# Patient Record
Sex: Female | Born: 1940 | Race: White | Hispanic: No | Marital: Single | Smoking: Never smoker
Health system: Southern US, Community
[De-identification: ages and names within clinical notes are randomized; demographics above are authoritative.]

## PROBLEM LIST (undated history)

## (undated) DIAGNOSIS — Z923 Personal history of irradiation: Secondary | ICD-10-CM

## (undated) DIAGNOSIS — E785 Hyperlipidemia, unspecified: Secondary | ICD-10-CM

## (undated) DIAGNOSIS — C50919 Malignant neoplasm of unspecified site of unspecified female breast: Secondary | ICD-10-CM

## (undated) HISTORY — DX: Hyperlipidemia, unspecified: E78.5

---

## 2007-05-23 DIAGNOSIS — C50919 Malignant neoplasm of unspecified site of unspecified female breast: Secondary | ICD-10-CM

## 2007-05-23 HISTORY — PX: BREAST LUMPECTOMY: SHX2

## 2007-05-23 HISTORY — DX: Malignant neoplasm of unspecified site of unspecified female breast: C50.919

## 2007-06-26 ENCOUNTER — Ambulatory Visit: Payer: Self-pay | Admitting: Cardiovascular Disease

## 2007-06-27 ENCOUNTER — Ambulatory Visit (HOSPITAL_COMMUNITY): Admission: RE | Admit: 2007-06-27 | Discharge: 2007-06-27 | Payer: Self-pay | Admitting: Cardiovascular Disease

## 2007-06-27 ENCOUNTER — Ambulatory Visit: Payer: Self-pay | Admitting: Internal Medicine

## 2007-07-10 ENCOUNTER — Ambulatory Visit: Payer: Self-pay | Admitting: Cardiovascular Disease

## 2007-07-11 ENCOUNTER — Ambulatory Visit (HOSPITAL_COMMUNITY): Admission: RE | Admit: 2007-07-11 | Discharge: 2007-07-11 | Payer: Self-pay | Admitting: Family Medicine

## 2007-08-05 ENCOUNTER — Ambulatory Visit (HOSPITAL_COMMUNITY): Admission: RE | Admit: 2007-08-05 | Discharge: 2007-08-05 | Payer: Self-pay | Admitting: Family Medicine

## 2007-08-12 ENCOUNTER — Encounter: Admission: RE | Admit: 2007-08-12 | Discharge: 2007-08-12 | Payer: Self-pay | Admitting: Family Medicine

## 2007-08-12 ENCOUNTER — Encounter (INDEPENDENT_AMBULATORY_CARE_PROVIDER_SITE_OTHER): Payer: Self-pay | Admitting: Diagnostic Radiology

## 2007-08-16 ENCOUNTER — Ambulatory Visit (HOSPITAL_COMMUNITY): Admission: RE | Admit: 2007-08-16 | Discharge: 2007-08-16 | Payer: Self-pay | Admitting: Family Medicine

## 2007-09-03 ENCOUNTER — Encounter (INDEPENDENT_AMBULATORY_CARE_PROVIDER_SITE_OTHER): Payer: Self-pay | Admitting: General Surgery

## 2007-09-03 ENCOUNTER — Encounter: Admission: RE | Admit: 2007-09-03 | Discharge: 2007-09-03 | Payer: Self-pay | Admitting: General Surgery

## 2007-09-03 ENCOUNTER — Ambulatory Visit (HOSPITAL_COMMUNITY): Admission: RE | Admit: 2007-09-03 | Discharge: 2007-09-03 | Payer: Self-pay | Admitting: General Surgery

## 2007-09-06 ENCOUNTER — Ambulatory Visit: Payer: Self-pay | Admitting: Oncology

## 2007-09-25 ENCOUNTER — Ambulatory Visit: Admission: RE | Admit: 2007-09-25 | Discharge: 2007-12-13 | Payer: Self-pay | Admitting: Radiation Oncology

## 2007-10-03 ENCOUNTER — Ambulatory Visit: Payer: Self-pay | Admitting: Cardiovascular Disease

## 2008-07-16 ENCOUNTER — Ambulatory Visit (HOSPITAL_COMMUNITY): Admission: RE | Admit: 2008-07-16 | Discharge: 2008-07-16 | Payer: Self-pay | Admitting: Hematology and Oncology

## 2008-08-18 DIAGNOSIS — E7800 Pure hypercholesterolemia, unspecified: Secondary | ICD-10-CM

## 2008-08-18 DIAGNOSIS — I10 Essential (primary) hypertension: Secondary | ICD-10-CM | POA: Insufficient documentation

## 2008-08-18 DIAGNOSIS — F329 Major depressive disorder, single episode, unspecified: Secondary | ICD-10-CM

## 2008-08-18 DIAGNOSIS — R0602 Shortness of breath: Secondary | ICD-10-CM | POA: Insufficient documentation

## 2008-08-18 DIAGNOSIS — R609 Edema, unspecified: Secondary | ICD-10-CM | POA: Insufficient documentation

## 2008-08-18 DIAGNOSIS — R079 Chest pain, unspecified: Secondary | ICD-10-CM

## 2008-08-25 ENCOUNTER — Ambulatory Visit: Payer: Self-pay | Admitting: Cardiovascular Disease

## 2009-01-28 ENCOUNTER — Encounter (INDEPENDENT_AMBULATORY_CARE_PROVIDER_SITE_OTHER): Payer: Self-pay | Admitting: *Deleted

## 2009-02-16 ENCOUNTER — Encounter: Payer: Self-pay | Admitting: Cardiovascular Disease

## 2009-07-28 ENCOUNTER — Ambulatory Visit (HOSPITAL_COMMUNITY): Admission: RE | Admit: 2009-07-28 | Discharge: 2009-07-28 | Payer: Self-pay | Admitting: Hematology and Oncology

## 2010-06-12 ENCOUNTER — Encounter: Payer: Self-pay | Admitting: Family Medicine

## 2010-06-23 ENCOUNTER — Other Ambulatory Visit: Payer: Self-pay | Admitting: General Surgery

## 2010-06-28 ENCOUNTER — Ambulatory Visit
Admission: RE | Admit: 2010-06-28 | Discharge: 2010-06-28 | Disposition: A | Discharge status: Home / Self Care | Payer: BC Managed Care – PPO | Source: Ambulatory Visit | Attending: General Surgery | Admitting: General Surgery

## 2010-07-11 ENCOUNTER — Ambulatory Visit (HOSPITAL_COMMUNITY)
Admission: RE | Admit: 2010-07-11 | Discharge: 2010-07-11 | Disposition: A | Discharge status: Home / Self Care | Payer: BC Managed Care – PPO | Source: Ambulatory Visit | Attending: General Surgery | Admitting: General Surgery

## 2010-07-11 ENCOUNTER — Other Ambulatory Visit: Payer: Self-pay | Admitting: General Surgery

## 2010-07-11 ENCOUNTER — Encounter (HOSPITAL_COMMUNITY): Payer: BC Managed Care – PPO

## 2010-07-11 ENCOUNTER — Other Ambulatory Visit (HOSPITAL_COMMUNITY): Payer: Self-pay | Admitting: General Surgery

## 2010-07-11 DIAGNOSIS — Z01811 Encounter for preprocedural respiratory examination: Secondary | ICD-10-CM

## 2010-07-11 DIAGNOSIS — Z01818 Encounter for other preprocedural examination: Secondary | ICD-10-CM | POA: Insufficient documentation

## 2010-07-11 DIAGNOSIS — Z01812 Encounter for preprocedural laboratory examination: Secondary | ICD-10-CM | POA: Insufficient documentation

## 2010-07-11 DIAGNOSIS — R9431 Abnormal electrocardiogram [ECG] [EKG]: Secondary | ICD-10-CM | POA: Insufficient documentation

## 2010-07-11 DIAGNOSIS — I517 Cardiomegaly: Secondary | ICD-10-CM | POA: Insufficient documentation

## 2010-07-11 DIAGNOSIS — K449 Diaphragmatic hernia without obstruction or gangrene: Secondary | ICD-10-CM | POA: Insufficient documentation

## 2010-07-11 DIAGNOSIS — Z01810 Encounter for preprocedural cardiovascular examination: Secondary | ICD-10-CM | POA: Insufficient documentation

## 2010-07-11 LAB — DIFFERENTIAL
Basophils Relative: 0 % (ref 0–1)
Eosinophils Absolute: 0.2 10*3/uL (ref 0.0–0.7)
Eosinophils Relative: 2 % (ref 0–5)
Lymphocytes Relative: 30 % (ref 12–46)
Monocytes Absolute: 0.7 10*3/uL (ref 0.1–1.0)
Monocytes Relative: 10 % (ref 3–12)

## 2010-07-11 LAB — CBC
MCH: 24.1 pg — ABNORMAL LOW (ref 26.0–34.0)
MCHC: 32 g/dL (ref 30.0–36.0)
MCV: 75.3 fL — ABNORMAL LOW (ref 78.0–100.0)
Platelets: 264 10*3/uL (ref 150–400)
RBC: 4.94 MIL/uL (ref 3.87–5.11)
RDW: 15.5 % (ref 11.5–15.5)

## 2010-07-11 LAB — COMPREHENSIVE METABOLIC PANEL
ALT: 17 U/L (ref 0–35)
Alkaline Phosphatase: 121 U/L — ABNORMAL HIGH (ref 39–117)
Calcium: 9 mg/dL (ref 8.4–10.5)
Chloride: 104 mEq/L (ref 96–112)
GFR calc Af Amer: >60 mL/min (ref >60)
GFR calc non Af Amer: 59 mL/min — ABNORMAL LOW (ref >60)
Glucose, Bld: 117 mg/dL — ABNORMAL HIGH (ref 70–99)
Potassium: 2.9 mEq/L — ABNORMAL LOW (ref 3.5–5.1)
Sodium: 138 mEq/L (ref 135–145)
Total Bilirubin: 0.5 mg/dL (ref 0.3–1.2)

## 2010-07-11 LAB — SURGICAL PCR SCREEN
MRSA, PCR: NEGATIVE
Staphylococcus aureus: POSITIVE — AB

## 2010-07-12 LAB — PREALBUMIN: Prealbumin: 15.4 mg/dL — ABNORMAL LOW (ref 17.0–34.0)

## 2010-07-18 ENCOUNTER — Inpatient Hospital Stay (HOSPITAL_COMMUNITY)
Admission: RE | Admit: 2010-07-18 | Discharge: 2010-07-18 | DRG: 467 | Disposition: A | Discharge status: Home / Self Care | Payer: BC Managed Care – PPO | Source: Ambulatory Visit | Attending: General Surgery | Admitting: General Surgery

## 2010-07-18 DIAGNOSIS — Z01818 Encounter for other preprocedural examination: Principal | ICD-10-CM

## 2010-07-18 DIAGNOSIS — Z01812 Encounter for preprocedural laboratory examination: Secondary | ICD-10-CM

## 2010-08-04 ENCOUNTER — Other Ambulatory Visit: Payer: Self-pay | Admitting: General Surgery

## 2010-08-04 ENCOUNTER — Encounter (HOSPITAL_COMMUNITY): Payer: BC Managed Care – PPO

## 2010-08-04 LAB — COMPREHENSIVE METABOLIC PANEL
AST: 37 U/L (ref 0–37)
Alkaline Phosphatase: 163 U/L — ABNORMAL HIGH (ref 39–117)
BUN: 14 mg/dL (ref 6–23)
CO2: 28 mEq/L (ref 19–32)
Calcium: 9.2 mg/dL (ref 8.4–10.5)
Chloride: 104 mEq/L (ref 96–112)
Glucose, Bld: 98 mg/dL (ref 70–99)
Potassium: 3.5 mEq/L (ref 3.5–5.1)
Sodium: 142 mEq/L (ref 135–145)
Total Bilirubin: 0.7 mg/dL (ref 0.3–1.2)
Total Protein: 7.4 g/dL (ref 6.0–8.3)

## 2010-08-04 LAB — CBC
HCT: 36.5 % (ref 36.0–46.0)
Hemoglobin: 11.5 g/dL — ABNORMAL LOW (ref 12.0–15.0)
MCV: 74.6 fL — ABNORMAL LOW (ref 78.0–100.0)
RDW: 15.3 % (ref 11.5–15.5)
WBC: 7.5 10*3/uL (ref 4.0–10.5)

## 2010-08-04 LAB — DIFFERENTIAL
Basophils Absolute: 0 10*3/uL (ref 0.0–0.1)
Basophils Relative: 0 % (ref 0–1)
Eosinophils Absolute: 0.2 10*3/uL (ref 0.0–0.7)

## 2010-08-05 LAB — PREALBUMIN: Prealbumin: 21.2 mg/dL (ref 17.0–34.0)

## 2010-08-11 ENCOUNTER — Inpatient Hospital Stay (HOSPITAL_COMMUNITY)
Admission: RE | Admit: 2010-08-11 | Discharge: 2010-08-13 | DRG: 155 | Disposition: A | Discharge status: Home / Self Care | Payer: BC Managed Care – PPO | Source: Ambulatory Visit | Attending: General Surgery | Admitting: General Surgery

## 2010-08-11 ENCOUNTER — Inpatient Hospital Stay (HOSPITAL_COMMUNITY): Admission: RE | Admit: 2010-08-11 | Payer: BC Managed Care – PPO | Source: Ambulatory Visit | Admitting: General Surgery

## 2010-08-11 DIAGNOSIS — I10 Essential (primary) hypertension: Secondary | ICD-10-CM | POA: Diagnosis present

## 2010-08-11 DIAGNOSIS — Z01812 Encounter for preprocedural laboratory examination: Secondary | ICD-10-CM

## 2010-08-11 DIAGNOSIS — K449 Diaphragmatic hernia without obstruction or gangrene: Principal | ICD-10-CM | POA: Diagnosis present

## 2010-08-12 ENCOUNTER — Inpatient Hospital Stay (HOSPITAL_COMMUNITY): Payer: BC Managed Care – PPO

## 2010-08-12 MED ORDER — IOHEXOL 300 MG/ML  SOLN
50.0000 mL | Freq: Once | INTRAMUSCULAR | Status: AC | PRN
  Administered 2010-08-12: 50 mL via ORAL

## 2010-08-31 NOTE — Op Note (Signed)
NAMEBRENDALYN, [name] [name]                 ACCOUNT NO.:  [id number]  MEDICAL RECORD NO.:  [id number]           PATIENT TYPE:  I  LOCATION:  1527                         FACILITY:  [facility]  PHYSICIAN:  Lennie Muckle, MD      DATE OF BIRTH:  1940-12-23  DATE OF PROCEDURE:  08/11/2010 DATE OF DISCHARGE:                              OPERATIVE REPORT   PREOPERATIVE DIAGNOSIS:  Paraesophageal hernia.  POSTOPERATIVE DIAGNOSIS:  Paraesophageal hernia.  PROCEDURE:  Laparoscopic paraesophageal hernia repair with Nissen.  SURGEON:  Lennie Muckle, MD.  ASSISTANT:  Abigail Miyamoto, M.D.  ESTIMATED BLOOD LOSS:  Minimal.  ANESTHESIA:  General.  DRAIN:  A 19 Blake drain was placed in the mediastinal space.  COMPLICATIONS:  No immediate complications.  INDICATIONS FOR PROCEDURE:  [name] is a very pleasant 70 year old female who I had previously performed breast surgery on.  She had emesis noted over the summer, had an endoscopy revealed a large hiatal hernia. She did have anemia with iron deficient.  Due to her melena and reflux disease, I felt that she probably needed a repair of her hernia.  She did have a CT scan, which showed a large portion of her stomach within the chest cavity.  I verified with a barium swallow as well, which revealed half of the stomach within the chest.  There was reflux and the primary peristalsis was as normal for her age.  We had tentatively planned for a day earlier in February, however, she suffered a bug bite and was unable to have the procedure.  She received antibiotics and seen back in the office and she had resolution of her infection.  She was ready to have her procedure.  Informed consent was obtained.  PROCEDURE IN DETAIL:  The patient was identified in the preoperative holding area, seen by Anesthesia, received IV antibiotics preoperatively, and taken to the operating room.  Once in the operating room, placed in supine position.  After administration  of general endotracheal anesthesia, sequential compression devices were applied to her lower extremity.  A Foley catheter was placed.  Her abdomen was then prepped and draped in usual sterile fashion.  Surgical time-out was performed.  I began by placing a 5 mm trocar using Optiview into the abdominal cavity on the left side of the abdomen.  After insufflating the abdomen, I inspected the abdomen and found no evidence of injury upon placement of trocar.  I then placed 2 additional trocars on the left side of the abdomen, one at the midline and one on the right side under visualization of the camera.  One of these was 11 mm trocar in the left side of the abdomen.  We began by identifying the liver.  There was a large left lobe of the liver.  I placed a 5 mm trocar at the epigastric region.  After placing the port, I placed the liver retractor to hold up the left lobe of the liver.  This was secured in place allowing [place] to visualize the hiatus.  There was a rather large hernia defect.  The stomach was spontaneously reduced down  into the chest when the patient was placed in reverse Trendelenburg position.  I began by dissecting the thin adhesion between the stomach and the liver.  Using a Harmonic scalpel, I transected this carried me up into the right crus of the diaphragm.  I was able to separate the hernia sac from the right crus of the diaphragm.  Using blunt dissection and the Harmonic scalpel, cleared the hernia sac off the right crus.  I carried this anteriorly and posteriorly.  I was able to lift the esophagus and stomach off the right crus and then identified the left crus without much difficulty.  I continued dissecting anteriorly the hernia sac from the diaphragm.  She had a large amount of paraesophageal fat in the hernia sac.  I then began dissecting the short gastrics taking these down with the Harmonic scalpel leading [place] up to the left crus of the diaphragm.  We  fully mobilized the greater curve of the stomach taking down the short gastrics.  I was able to identify fully the left crus of the diaphragm. I dissected the hernia sac from the left crus.  I was able to place a Penrose around the GE junction.  I continued dissecting the loose attachments of the esophagus while tugging on the Penrose drain.  I was able to carry up into the chest and underneath the heart.  Mostly using the Harmonic scalpel and blunt dissection, I was able to fully mobilize the esophagus.  I identified the anterior and vagus nerves.  This gave me enough length for the stomach greater than 2 cm within the abdominal cavity.  I then started dissecting the hernia sac off the stomach.  It was a rather thick sac, but I was able to dissect this away from the stomach to fully visualize the GE junction.  I had to clip the small feeding vessel with a clip applier till there was a small amount of bleeding.  After controlling this, I continued dissecting the hernia sac off the lesser curve of the stomach.  This coursed up near the esophagus, but I stayed clear distance away using Harmonic scalpel and transected the sac to remove this from the vicinity in order to facilitate the wrap in the future. After removing the fat, I placed it in the EndoCatch bag and removed it from the abdomen.  I was then able to close the diaphragm using interrupted sutures and the autosuture device.  I used pledgets and placed approximately 4 sutures for closure. At that time, I was able to lay the stomach in normal anatomic position. I began by placing the cardiac region of the stomach around posteriorly to facilitate the wrap portion.  Once I had the stomach and positioned for the wrap, I asked Anesthesia to pass a #56 bougie.  This was placed without difficulty.  We watched this coming to the abdomen.  Once this was in position, I was able to perform the wrap using the autosuture device.  Three sutures  were placed with the length of 4 cm.  I did grasp a small amount of the esophageal tissue in the superior portions of the sutures.  I then tacked the superior portion of the wrap to the crus on the left and right portion to prevent from slippage.  I then secured a 19 Blake drain into the mediastinal space.  This was secured with a 3-0 nylon suture.  I then irrigated the abdomen.  I inspected the wrap was in good position.  I found no bleeding.  I then released the liver, closed the fascial defect at the liver.  I removed the 11 mm trocar and closed the fascia with a 0 Vicryl suture using a suture passer.  I did one final inspection of the abdomen, found no evidence of bleeding.  No evidence of organ injury.  I then released pneumoperitoneum, removed the trocars.  Approximately, 40 mL of 0.25% Marcaine with epinephrine were placed for local block.  The skin was closed with 4-0 Monocryl.  Dermabond was placed for final dressing.  The patient was extubated, transferred to postanesthesia care unit in stable condition.  She will be monitored for a day or so.  I will get a swallow tomorrow and then hopefully home on Saturday.     Lennie Muckle, MD     ALA/MEDQ  D:  08/11/2010  T:  08/12/2010  Job:  [id number]  cc:   Milana Kidney, MD [facility]  Viviann Spare MD Azucena Kuba  Dr. Dewayne Shorter [facility]  Electronically Signed by Bertram Savin MD on 08/31/2010 12:05:02 PM

## 2010-09-26 NOTE — Discharge Summary (Signed)
  NAMEERRYN, DICKISON                 ACCOUNT NO.:  [id number]  MEDICAL RECORD NO.:  [id number]           PATIENT TYPE:  I  LOCATION:  1527                         FACILITY:  [facility]  PHYSICIAN:  Lennie Muckle, MD      DATE OF BIRTH:  December 24, 1940  DATE OF ADMISSION:  08/11/2010 DATE OF DISCHARGE:  08/13/2010                              DISCHARGE SUMMARY   FINAL DIAGNOSIS:  Paraesophageal hernia.  PROCEDURE:  March 22 paraesophageal hernia repair.  HOSPITAL COURSE:  [name] was admitted following her paraesophageal hernia.  She had a gastrograph and swallow on postoperative day #1.  No leak was evident.  She was able to be started on her liquid diet.  She was able to tolerate her liquids.  Her drain was removed.  Her pain was adequately controlled on oral narcotics.  She has been ambulating without difficulty.  She will be discharged with the Percocet for pain. Instructed no heavy lifting.  Continue on her liquid diet for 3 weeks. Follow up with me in approximately 2 to 3 weeks' time.  CONDITION ON DISCHARGE:  Condition upon discharge is stable.  MEDICATIONS:  She will continue on all of her home medications including aspirin, Diovan, folic acid, iron, metoprolol, Mobic, Nexium, Norvasc, potassium and Prozac.     Lennie Muckle, MD     ALA/MEDQ  D:  08/31/2010  T:  08/31/2010  Job:  [id number]  Electronically Signed by Bertram Savin MD on 09/26/2010 10:39:22 AM

## 2010-09-29 ENCOUNTER — Other Ambulatory Visit (HOSPITAL_COMMUNITY): Payer: Self-pay | Admitting: Hematology and Oncology

## 2010-09-29 DIAGNOSIS — C50919 Malignant neoplasm of unspecified site of unspecified female breast: Secondary | ICD-10-CM

## 2010-10-04 NOTE — Assessment & Plan Note (Signed)
[facility]                       [place] [facility]IOLOG[facility] OFFICE NOTE   NAME:[name], [name]                          MRN:          [id number]  DATE:06/25/2007                            DOB:          1941/03/27    [name] [name] seen at request of Dr. __________.  She is the sister-in-law of one  of my long-term the patients, [name] [name].  She was recently  hospitalized in [place] for dyspnea.  The patient was visiting her  sister she had acute onset of shortness of breath.  Unfortunately do not  have a lot of records from there.  It appears that on November 17 she  had a stress Myoview.   The patient received adenosine.  The report reads that she had a  moderate-sized anterior septal apical inferior lateral reversible  perfusion defect consistent with ischemia, and notation indicates needs  heart catheter.  EF was normal at 65%.   Apparently this was cancelled.  I do not all of the details but [name] [name]'s  sister-in-law wanted her to come see me.   In talking to the patient a coronary risk factors include hypertension.   The patient does not smoke.  She is a nondiabetic.  Cholesterol status  is unknown.   FAMILY HISTORY:  Negative.   She is a nondiabetic.   The patient has noticed increasing shortness of breath over the last  year.  There is no direct history of asthma.  I do not have PFTs.  She  is a nonsmoker.  She has not had a recent cough there is no history of  DVT or PE.  No history of pulmonary hypertension.  Do not have a chest x-  ray report on her or any discharge summary from her hospitalization.  She went to visit her sister and said that she got wheezy.  She thinks  part of this is a hiatal hernia.  She is apparently has a fairly large  one is on [name].  The patient was in the hospital for 3 days and was  discharged.  Since discharge she has not had any acute episodes but does  note increasing exertional dyspnea.  Systems otherwise negative.   PAST MEDICAL HISTORY:  Is remarkable for previous hysterectomy, previous  wrist surgery hypertension, question hyperlipidemia occurred with hiatal  hernia, depression previous excessive alcohol, and question iron  deficiency.   FAMILY HISTORY:  Is remarkable for alcoholism on her father's side, died  at age 80.  Brain cancer on mother's side.  The patient has been  divorced twice.  She has three children.  She works at [employer] in [employer].  She enjoys sewing.   CURRENT MEDICATIONS:  Include Cardura 10/10, Diovan 320/12.5 [name] 20,  Prozac 20, aspirin a day, potassium an iron.  She denies any allergies.   EXAM:  Is remarkable for a overweight white female with a jovial affect  in no distress.  Her weight is 191, blood pressure is 150/80, pulse 80 and regular,  afebrile.  HEENT:  Unremarkable.  Carotids are without bruit.  No adenopathy meds JVP elevation was  clear.  Diaphragmatic motion.  No wheezing.  S1-S2 normal heart sounds.  PMI normal at this benign.  Bowel sounds positive, status post hysterectomy.  No bruit.  No  tenderness.  No AAA no past no hepatojugular reflux.  Distal pulse intact.  No edema.  NEURO:  Nonfocal.  Skin warm, dry.  No muscle weakness.   EKG shows sinus rhythm, nonspecific ST-T wave changes.  No previous MI.   IMPRESSION:  1. Hospitalization for somewhat atypical chest pain and dyspnea      abnormal Myoview.  I need to get the actual images certainly given      her body habitus.  This could be a false positive.  Clinical      scenario is atypical.  Her EKG has nonspecific after I get the      actual images we will make a decision regarding possible heart cath      versus dobutamine echo.  2. Hypertension currently borderline control.  Continue current      medications.  Consider adding clonidine will have her monitor her      blood pressures at home.  3. Cholesterol status unknown.  Need to follow-up lipid liver profile.       Consider addition a statin drug.  4. History of depression with alcoholism.  Continue Prozac 20 a day,      currently abstinent from alcohol.  5. Large hiatal hernia with reflux.  Continue [name] 20 a day may be      the etiology to her dyspnea and chest pain.  6. Question of pulmonary problem with wheezing and exertional dyspnea      check 2-D echocardiogram to assess RV and LV function PFTs pre and      positive bronchodilator.   Further recommendations based on the results of my review of the actual  pictures from her Myoview echo and PFTs I will see her back in a few  weeks when in.     Noralyn Pick. Eden Emms, MD, [facility]  Electronically Signed    PCN/MedQ  DD: 06/26/2007  DT: 06/27/2007  Job #: [id number]

## 2010-10-04 NOTE — Procedures (Signed)
NAMEDAYAMI, [name] [name]                 ACCOUNT NO.:  [id number]   MEDICAL RECORD NO.:  [id number]          PATIENT TYPE:  OUT   LOCATION:  RESP                          FACILITY:  [facility]   PHYSICIAN:  Edward L. Juanetta Gosling, M.D.DATE OF BIRTH:  1941/04/16   DATE OF PROCEDURE:  06/27/2007  DATE OF DISCHARGE:  06/27/2007                            PULMONARY FUNCTION TEST   1. Spirometry shows no ventilatory defect and no definite airflow      obstruction.  2. Lung volumes are normal.  3. DLCO is mildly reduced.  4. Arterial blood gases are normal.      Edward L. Juanetta Gosling, M.D.  Electronically Signed     ELH/MEDQ  D:  07/05/2007  T:  07/06/2007  Job:  [id number]   cc:   Noralyn Pick. Eden Emms, MD, [facility]  [address]  [address]  [place]  [place] [address]

## 2010-10-04 NOTE — Procedures (Signed)
[name] [name], [name] [name]                 ACCOUNT NO.:  [id number]   MEDICAL RECORD NO.:  [id number]          PATIENT TYPE:  OUT   LOCATION:  RAD                           FACILITY:  [facility]   PHYSICIAN:  Pricilla Riffle, MD, FACCDATE OF BIRTH:  February 07, 1941   DATE OF PROCEDURE:  06/27/2007  DATE OF DISCHARGE:                                ECHOCARDIOGRAM   TEST INDICATION:  A 70 year old with history of hypertension and  shortness of breath, test to evaluate.   2-D ECHO WITH ECHO DOPPLER:  Left ventricle is normal in size with an  end-diastolic dimension of 45 mm.  The interventricular septum and  posterior wall are mild to moderately thickened at 16 mm each.  Left  atrium is normal at 34 mm.  Right atrium and right ventricle are normal.  Aortic root is normal at 37 mm.   The aortic valve is mildly sclerotic.  There is trace insufficiency.  No  stenosis.  Mitral valve is mildly thickened with trace insufficiency.  Pulmonic valve is normal, no insufficiency.  Tricuspid valve is normal  with no insufficiency.   Overall LV systolic function is normal with an LVEF of 60-70%.  Note,  there appears to be mild diastolic dysfunction.  RVEF is normal.   No pericardial effusion is seen.      Pricilla Riffle, MD, [facility]  Electronically Signed     PVR/MEDQ  D:  06/27/2007  T:  06/28/2007  Job:  [id number]   cc:   Melvyn Novas, MD  Fax: [phone]

## 2010-10-04 NOTE — Op Note (Signed)
NAMESHAMONIQUE, BATTISTE                 ACCOUNT NO.:  [id number]   MEDICAL RECORD NO.:  [id number]          PATIENT TYPE:  AMB   LOCATION:  SDS                          FACILITY:  [facility]   PHYSICIAN:  Lennie Muckle, MD      DATE OF BIRTH:  1941/05/05   DATE OF PROCEDURE:  09/03/2007  DATE OF DISCHARGE:  09/03/2007                               OPERATIVE REPORT   PREOPERATIVE DIAGNOSES:  Left breast cancer.   POSTOPERATIVE DIAGNOSES:  Left breast cancer.   PROCEDURE:  Left breast lumpectomy and sentinel node biopsy.   SURGEON:  Lennie Muckle, MD   ASSISTANT:  Ollen Gross. Vernell Morgans, M.D.   ANESTHESIA:  General endotracheal anesthesia.   FINDINGS:  Calcifications, wire and clip within specimen based on the  mammographic imaging.   ESTIMATED BLOOD LOSS:  Minimal amount of bleeding.   INDICATIONS FOR PROCEDURE:  Ms. 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  Informed  consent was obtained for a lumpectomy, sentinel lymph node with  possibility of an axillary node dissection.   DETAILS OF PROCEDURE:  She was identified in the preoperative holding  suite where she previously had a wire placement in her left breast.  She  had received an injection of the radioactive tracer material.  She was  then taken to the operating room, placed in supine position.  After  administration of general endotracheal anesthesia, her left arm and left  breast were prepped and draped in usual sterile fashion.  A time out  procedure indicating the patient and procedure were performed.  Methylene blue was injected into the left breast in all 4 quadrants just  beneath the areola and massaged the breast for approximately 5 minutes.  Using the NeoProbe as a guide, I placed an incision on the left axilla  in the area of increased intensity.  Subcutaneous tissue divided with  electrocautery.   I was able to identify a lymph node which was deep to  the pectoralis muscle measured 2808 on the NeoProbe and was blue.  This  was sent to pathology.  A touch prep revealed no abnormal cells.  Subcutaneous tissues were closed after irrigation.  Monocryl was used to  close the skin.   Then, I turned my attention to the left breast.  The mass was  approximately 10 cm superior to the nipple at 12 o'clock position based  on mammographic imaging. An incision was placed approximately two thirds  the distant from the nipple and the wire.  I created a flap superiorly  with the wire in position.  The excisional lumpectomy specimen was  removed using the wire as a guide.  I did go deep onto  the pec muscle.  A specimen was oriented for pathology with a short single suture marking  the anterior margin.  A short double marking superior along lateral, a  single with lateral border and double along the  deep margin.  This was  sent to radiographic imaging.  The clip was noted as well as the wire  with the specimen in the lumpectomy tissue.  The area was irrigated.  There was no evidence of bleeding on final inspection.  The wound bed  was closed with 3-0 Vicryl suture.  A 4-0 was used to close the skin.  The area was injected with approximately 30 mL of lidocaine to fill the  void.  Dermabond was placed for final dressing.  The patient was then  extubated and transferred to the post anesthesia care unit in stable  condition.  I will call her with her final pathology and see her back in  approximately 2-3 weeks.  She is instructed to perform no heavy lifting  with her left arm.  Begin stretching this tomorrow, may shower tonight  and is given Percocet for pain.      Lennie Muckle, MD  Electronically Signed     ALA/MEDQ  D:  09/03/2007  T:  09/04/2007  Job:  [id number]

## 2010-10-04 NOTE — Assessment & Plan Note (Signed)
[facility]                       [place] [facility]ARDIOLOG[facility] OFFICE NOTE   NAME:[name] [name] HOCHBERG                        MRN:          [id number]  DATE:07/10/2007                            DOB:          [birth year]    Sunday returns today for follow-up.  I actually finally received records  from [facility] in [place], [place].  I reviewed her  Myoview study.  I was a bit unimpressed.  The inferior lateral defect  that was caused was misread.  She actually had more uptake in the  inferior lateral wall on stress images than rest images.  There was a  small apical defect, but this was probably rotation of axis deficit  particularly in a patient with such a large amount of breast tissue.  I  did not think the patient warranted heart cath at this time, in  particular after talking their she has not had any recurrent chest pain.   A 2-D echocardiogram was normal except for diastolic dysfunction.   I suspect that her dyspnea is a combination of being overweight,  hypertensive with diastolic dysfunction.   REVIEW OF SYSTEMS:  Remarkable for no significant chest pain,  palpitations, PND, she has mild lower extremity edema.   She has had a recent GI illness and lost about 12 pounds.   Otherwise negative.   MEDICATIONS:  She is on  1. Caduet 10/25.  2. Diovan 320/12.5.  3. [name] 20 a day.  4. Prozac 20 a day.  5. An aspirin a day.  6. Potassium 20 a day.  7. Iron.   [place] uses [employer] in [place]   PHYSICAL EXAMINATION:  GENERAL APPEARANCE:  Her exam is remarkable for a  large-breasted, overweight white female in no distress.  Affect is  appropriate.  VITAL SIGNS:  Weight is 190, blood pressure is 130/80, pulse 70 and  regular, respiratory rate 16, afebrile.  HEENT:  Unremarkable.  NECK:  Carotids are without bruit.  No lymphadenopathy, thyromegaly or  JVP elevation.  LUNGS:  Clear with good  diaphragmatic motion.  No wheezing.  CARDIAC:  S1-S2, normal heart sounds.  PMI normal.  ABDOMEN:  Benign.  Bowel sounds positive.  No AAA.  No  hepatosplenomegaly or hepatojugular reflux.  EXTREMITIES:  Distal pulses intact.  Trace edema.  NEUROLOGIC:  Nonfocal.  SKIN:  Warm and dry.  No muscle weakness.   IMPRESSION:  Isolated episode of chest pain.  Myoview reviewed and  thought to be low-risk, no recurrent chest pain.  Continue risk factor  modification.   RECOMMENDATIONS:  1. Cath only for recurrent more typical symptoms, continue aspirin a      day.  2. Hypertension, currently well-controlled.  Continue Caduet and      Diovan, low-salt diet.  3. Lower extremity edema.  I will increase the hydrochlorothiazide      component of the Diovan to 320/25, low-salt diet.  Samples were      given.  Three to four weeks after being on the high dose diuretic,      she will have her potassium checked.  4. Hypercholesterolemia.  Continue Caduet, low cholesterol diet.   Overall, [name] [name] seems to be doing well.  She would appear to have  hypertension with some diastolic dysfunction.  We will try to treat her  risk factors as well as possible and she will let us know if she has any  significant chest pain.     [name] [name]. Eden Emms, MD, [facility]  Electronically Signed    PCN/MedQ  DD: 07/10/2007  DT: 07/11/2007  Job #: [id number]

## 2010-10-04 NOTE — Assessment & Plan Note (Signed)
[facility]                       [place] CARDIOLOGY OFFICE NOTE   NAME:Azer, FABIHA ROUGEAU                        MRN:          [id number]  DATE:10/03/2007                            DOB:          1940/11/26    HISTORY OF PRESENT ILLNESS:  Yarisa Lynam returns today for followup.  I  have seen her in the past for dyspnea and atypical chest pain.  She had  a Myoview done at outside hospital which I reviewed and thought was  normal.  She has been doing well.  Her hypotension and  hypercholesterolemia are under better control.  Her lower extremity  edema has improved on a higher dose of diuretic.  She had her surgery  for breast cancer.  Apparently, this went well.  She needs adjuvant  radiation.  She has seen Dr. Cleone Slim.   Fortunately, she does not need chemotherapy.   REVIEW OF SYSTEMS:  Otherwise, negative.  In particular, she is not  having significant chest pain and her dyspnea is improved.   CURRENT MEDICATIONS:  1. Caduet 10/20.  2. Diovan 320/25.  3. [name] 20 a day.  4. Prozac 20 a day.  5. Aspirin a day.  6. Potassium and iron have been discontinued.  7. Actonel 35 mg a week.   She uses the [facility] in [place].  No medicines or  refills were needed today.   PHYSICAL EXAMINATION:  VITAL SIGNS:  Remarkable for weight of 188, blood  pressure 140/80, pulse 60 and regular, afebrile, respiratory rate 14.  HEENT:  Unremarkable.  NECK:  Carotids normal without bruit.  No lymphadenopathy, thyromegaly,  JVP elevation.  She is status post lumpectomy.  HEART:  S1-S2, normal heart sounds.  PMI normal.  ABDOMEN:  Benign.  Bowel sounds positive.  No AAA.  No tenderness.  No  bruit.  No hepatosplenomegaly.  No hepatojugular reflux.  Pulses are  intact.  No edema.  NEUROLOGICAL:  Nonfocal.  SKIN:  Warm and dry.  No muscular weakness.   IMPRESSION:  1. Chest pain resolved.  Low-risk Myoview.  Continue baby aspirin.  2. Hypertension  currently well-controlled.  Continue Caduet and      Diovan.  3. Lower extremity edema improved on higher dose HCTZ, continue low-      salt diet, elevate      legs at the end of the day.  4. Hypercholesterolemia.  Continue Caduet.  Lipid and liver profile in      6 months.   Overall, Kataleyah is doing well.  I will probably see her back in [place]  in 6 months.     Noralyn Pick. Eden Emms, MD, [facility]  Electronically Signed    PCN/MedQ  DD: 10/03/2007  DT: 10/03/2007  Job #: [id number]

## 2010-10-04 NOTE — Assessment & Plan Note (Signed)
[facility]                            CARDIOLOGY OFFICE NOTE   NAME:[name] [name], [name] [name]                        MRN:          [id number]  DATE:08/25/2008                            DOB:          [birth year]    [name] [name] returns today for followup.  She has had atypical chest pain in the  past.  She has not had any recurrences.   I believe some of her chest pain was related to the breast cancer which  was taken care of last year, she is on tamoxifen.  [name] [name] is her  oncologist.  Her blood pressure is borderline controlled.  She has been  compliant with her medicines.  We talked about low-sodium DASH type  diet.  She is currently on Diovan and potassium replacement as well as  beta blockers and Norvasc.  She sees Dr. Delbert Harness in [place] for  this.   She has mild exertional dyspnea.  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  Her review of systems are otherwise  looked at and all other systems negative.  She is allergic to CELEBREX.  Her medications include:  Tamoxifen 20 mg a day, ferrous sulfate,  vitamin D, potassium, Caltrate, Diovan HCT 320/25, Toprol 25 mg a day,  Nexium 40 mg a day, Norvasc 5 mg a day, Actonel, and folic acid.   PHYSICAL EXAMINATION:  GENERAL:  Remarkable for an overweight female in  no distress.  VITAL SIGNS:  Blood pressure is 160/80, pulse 70 and regular,  respiratory rate 14, afebrile.  HEENT:  Unremarkable.  NECK:  Carotids normal without bruit.  No lymphadenopathy, thyromegaly,  JVP elevation.  LUNGS:  Clear.  Good diaphragmatic motion.  No wheezing.  HEART:  S1, S2 with normal heart sounds.  PMI normal.  ABDOMEN:  Benign.  Bowel sounds positive.  No AAA.  No tenderness.  No  bruit.  No hepatosplenomegaly, hepatojugular reflux, or tenderness.  EXTREMITIES:  Distal pulses intact.  No edema.  NEUROLOGIC:  Nonfocal.  SKIN:   Warm and dry.  No muscular weakness.  Blood pressure was done on  the right arm since she is status post mastectomy.   EKG today shows sinus rhythm with nonspecific ST-T wave changes,  borderline LVH.   IMPRESSION:  1. Hypertension, currently well controlled.  Continue current dose of      beta blocker, Norvasc, Diovan HCT.  Follow up with Dr. Delbert Harness.      Low-sodium diet.  2. Previous chest pain without documented coronary disease.  Continue      aspirin therapy.  No recurrent chest pain.  3. Palpitations, benign, likely related to stress.  Currently in sinus      rhythm.  Continue low-dose beta-blocker.  4. History of breast cancer.  Follow up [name] [name].  Continue      adjuvant therapy with tamoxifen.   I will see her back in 6 months' time.     Noralyn Pick. Eden Emms, MD, [facility]  Electronically Signed  PCN/MedQ  DD: 08/25/2008  DT: 08/25/2008  Job #: [id number]

## 2010-10-05 ENCOUNTER — Ambulatory Visit (HOSPITAL_COMMUNITY)
Admission: RE | Admit: 2010-10-05 | Discharge: 2010-10-05 | Disposition: A | Discharge status: Home / Self Care | Payer: BC Managed Care – PPO | Source: Ambulatory Visit | Attending: Hematology and Oncology | Admitting: Hematology and Oncology

## 2010-10-05 DIAGNOSIS — Z853 Personal history of malignant neoplasm of breast: Secondary | ICD-10-CM | POA: Insufficient documentation

## 2010-10-05 DIAGNOSIS — C50919 Malignant neoplasm of unspecified site of unspecified female breast: Secondary | ICD-10-CM

## 2010-11-07 ENCOUNTER — Other Ambulatory Visit (INDEPENDENT_AMBULATORY_CARE_PROVIDER_SITE_OTHER): Payer: Self-pay | Admitting: General Surgery

## 2010-11-07 DIAGNOSIS — K828 Other specified diseases of gallbladder: Secondary | ICD-10-CM

## 2010-11-14 ENCOUNTER — Encounter (HOSPITAL_COMMUNITY)
Admission: RE | Admit: 2010-11-14 | Discharge: 2010-11-14 | Disposition: A | Discharge status: Home / Self Care | Payer: BC Managed Care – PPO | Source: Ambulatory Visit | Attending: General Surgery | Admitting: General Surgery

## 2010-11-14 DIAGNOSIS — K828 Other specified diseases of gallbladder: Secondary | ICD-10-CM | POA: Insufficient documentation

## 2010-11-14 DIAGNOSIS — Z01818 Encounter for other preprocedural examination: Secondary | ICD-10-CM | POA: Insufficient documentation

## 2010-11-14 DIAGNOSIS — Z01812 Encounter for preprocedural laboratory examination: Secondary | ICD-10-CM | POA: Insufficient documentation

## 2010-11-14 LAB — BASIC METABOLIC PANEL
BUN: 19 mg/dL (ref 6–23)
CO2: 26 mEq/L (ref 19–32)
Chloride: 103 mEq/L (ref 96–112)
Creatinine, Ser: 0.62 mg/dL (ref 0.50–1.10)
Glucose, Bld: 104 mg/dL — ABNORMAL HIGH (ref 70–99)
Potassium: 4 mEq/L (ref 3.5–5.1)

## 2010-11-14 LAB — CBC
HCT: 36.9 % (ref 36.0–46.0)
Hemoglobin: 12.1 g/dL (ref 12.0–15.0)
MCV: 73.8 fL — ABNORMAL LOW (ref 78.0–100.0)
WBC: 6.9 10*3/uL (ref 4.0–10.5)

## 2010-11-14 MED ORDER — TECHNETIUM TC 99M MEBROFENIN IV KIT
5.5000 | PACK | Freq: Once | INTRAVENOUS | Status: AC | PRN
  Administered 2010-11-14: 6 via INTRAVENOUS

## 2010-11-16 ENCOUNTER — Ambulatory Visit (HOSPITAL_COMMUNITY): Admission: RE | Admit: 2010-11-16 | Payer: BC Managed Care – PPO | Source: Ambulatory Visit | Admitting: General Surgery

## 2011-02-10 LAB — BLOOD GAS, ARTERIAL
Bicarbonate: 23.5
TCO2: 21.3
pCO2 arterial: 35.5
pH, Arterial: 7.437 — ABNORMAL HIGH

## 2011-02-14 LAB — COMPREHENSIVE METABOLIC PANEL
ALT: 18
AST: 30
Albumin: 3.9
Alkaline Phosphatase: 123 — ABNORMAL HIGH
BUN: 15
Chloride: 101
Potassium: 3.2 — ABNORMAL LOW
Total Bilirubin: 0.5

## 2011-02-14 LAB — CBC
HCT: 32.9 — ABNORMAL LOW
Platelets: 300
WBC: 8.8

## 2011-04-11 ENCOUNTER — Other Ambulatory Visit (HOSPITAL_COMMUNITY): Payer: Self-pay | Admitting: Hematology and Oncology

## 2011-04-11 DIAGNOSIS — C50919 Malignant neoplasm of unspecified site of unspecified female breast: Secondary | ICD-10-CM

## 2011-10-11 ENCOUNTER — Ambulatory Visit (HOSPITAL_COMMUNITY)
Admission: RE | Admit: 2011-10-11 | Discharge: 2011-10-11 | Disposition: A | Discharge status: Home / Self Care | Payer: BC Managed Care – PPO | Source: Ambulatory Visit | Attending: Hematology and Oncology | Admitting: Hematology and Oncology

## 2011-10-11 DIAGNOSIS — C50919 Malignant neoplasm of unspecified site of unspecified female breast: Secondary | ICD-10-CM

## 2011-10-11 DIAGNOSIS — Z853 Personal history of malignant neoplasm of breast: Secondary | ICD-10-CM | POA: Insufficient documentation

## 2011-10-18 ENCOUNTER — Encounter: Payer: BC Managed Care – PPO | Admitting: Hematology and Oncology

## 2011-10-18 DIAGNOSIS — M810 Age-related osteoporosis without current pathological fracture: Secondary | ICD-10-CM

## 2011-10-18 DIAGNOSIS — I10 Essential (primary) hypertension: Secondary | ICD-10-CM

## 2011-10-18 DIAGNOSIS — C50919 Malignant neoplasm of unspecified site of unspecified female breast: Secondary | ICD-10-CM

## 2012-05-18 IMAGING — RF DG ESOPHAGUS
19 of 24 series · 19 of 24 positions shown · non-contrast
Comparison: Plain film chest of 09/03/2007.

CLINICAL DATA: Hiatal hernia.  Evaluate for reflux.

ESOPHOGRAM/BARIUM SWALLOW
TECHNIQUE: Combined double contrast and single contrast
examination performed using effervescent crystals, thick barium
liquid, and thin barium liquid.
Fluoroscopy time:  3.3 minutes.

[Series 1: run · 1 of 8 slices shown (1 of 19)]
[im 1/8]
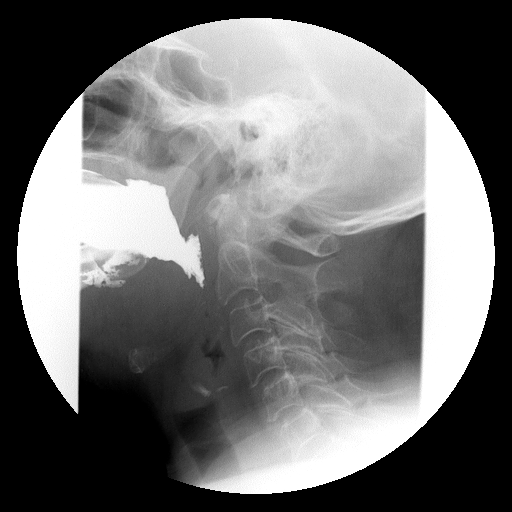

[Series 2: run · 1 of 1 slices shown (2 of 19)]
[im 1/1]
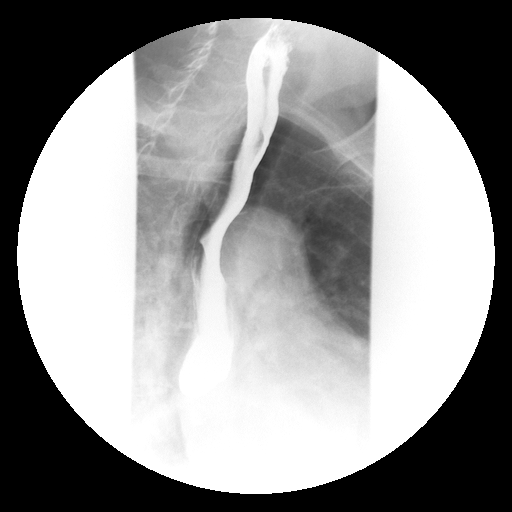

[Series 4: run · 1 of 1 slices shown (3 of 19)]
[im 1/1]
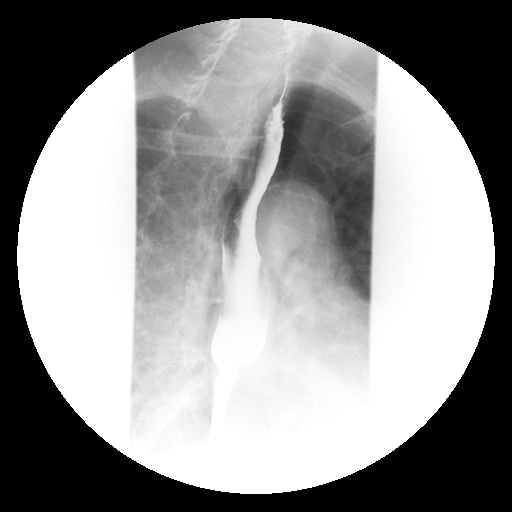

[Series 5: run · 1 of 1 slices shown (4 of 19)]
[im 1/1]
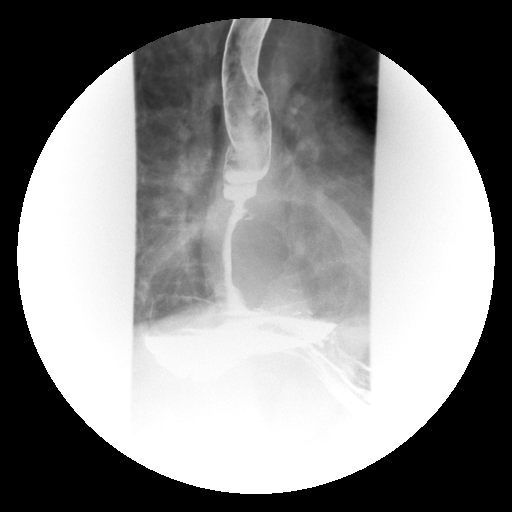

[Series 6: run · 1 of 1 slices shown (5 of 19)]
[im 1/1]
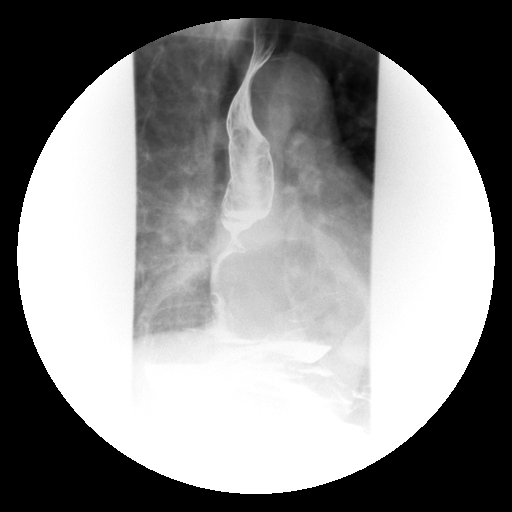

[Series 7: run · 1 of 1 slices shown (6 of 19)]
[im 1/1]
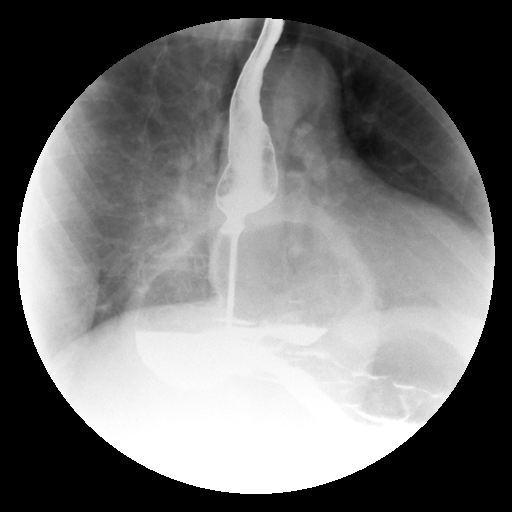

[Series 9: run · 1 of 1 slices shown (7 of 19)]
[im 1/1]
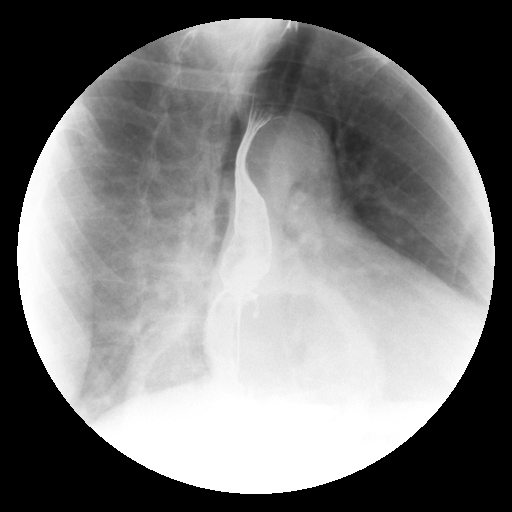

[Series 10: run · 1 of 1 slices shown (8 of 19)]
[im 1/1]
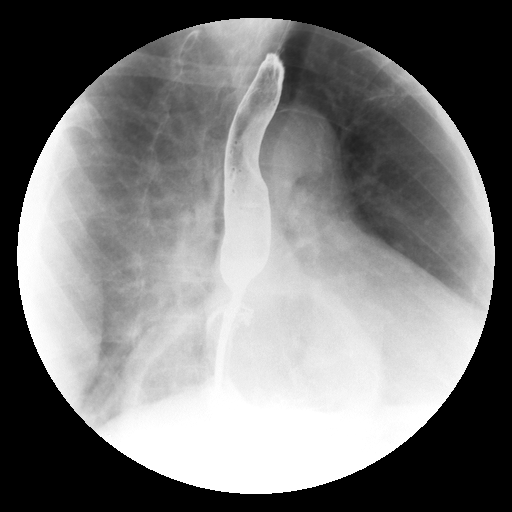

[Series 11: run · 1 of 1 slices shown (9 of 19)]
[im 1/1]
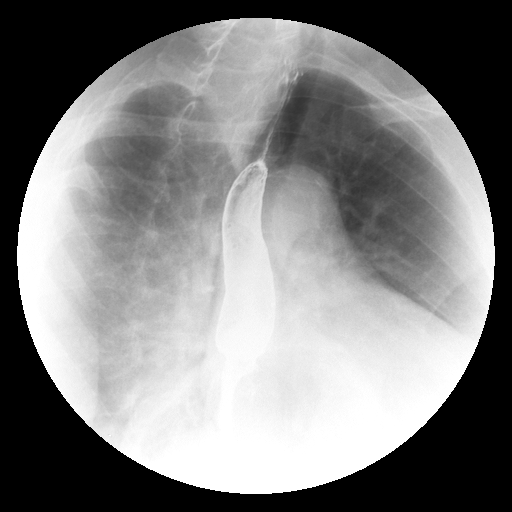

[Series 13: run · 1 of 1 slices shown (10 of 19)]
[im 1/1]
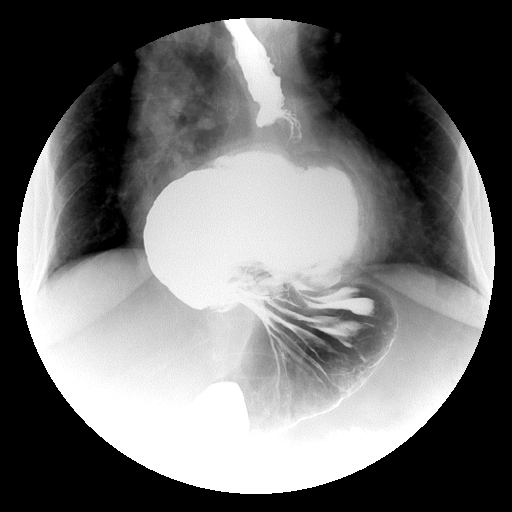

[Series 14: run · 1 of 1 slices shown (11 of 19)]
[im 1/1]
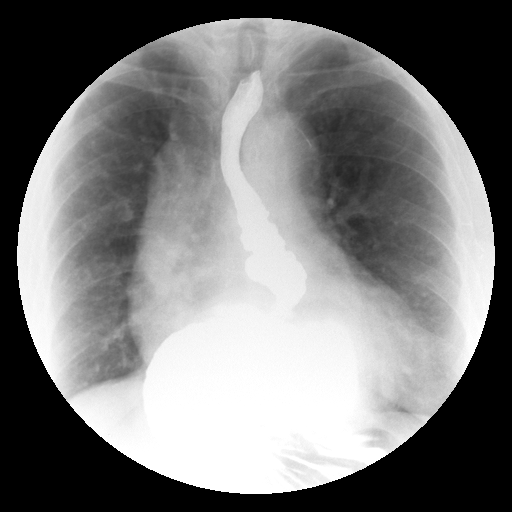

[Series 15: run · 1 of 1 slices shown (12 of 19)]
[im 1/1]
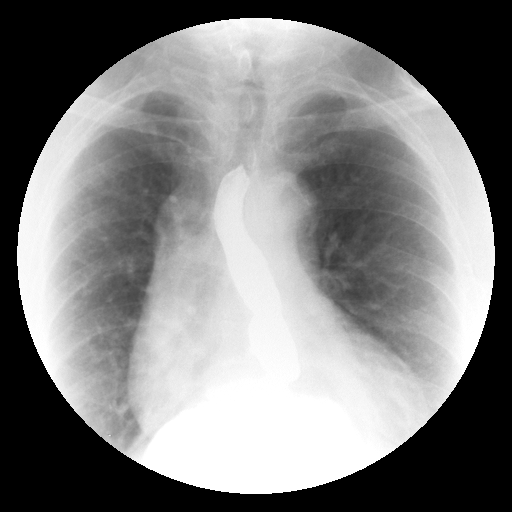

[Series 16: run · 1 of 1 slices shown (13 of 19)]
[im 1/1]
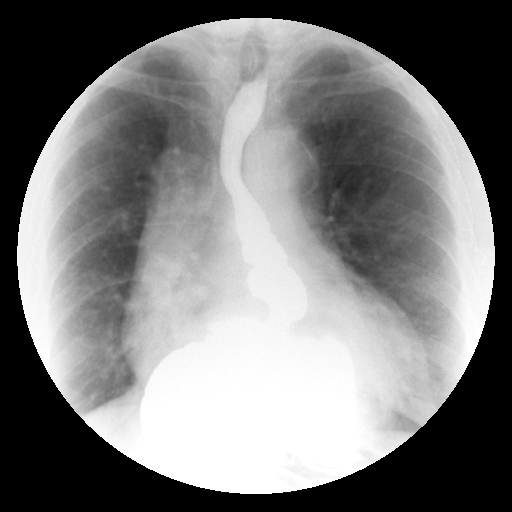

[Series 18: run · 1 of 1 slices shown (14 of 19)]
[im 1/1]
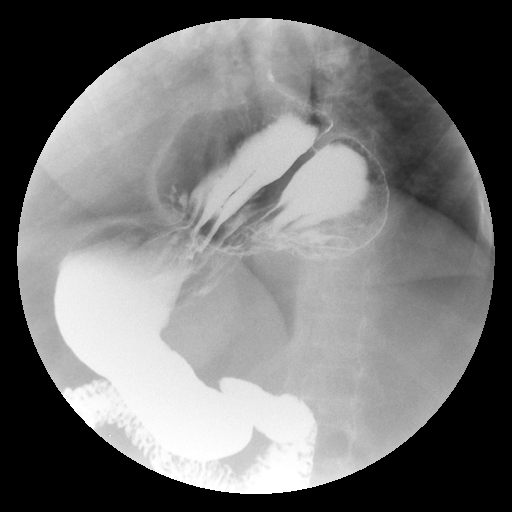

[Series 19: run · 1 of 1 slices shown (15 of 19)]
[im 1/1]
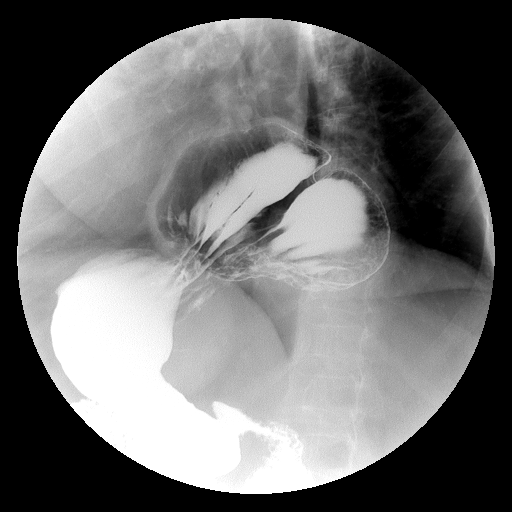

[Series 20: run · 1 of 1 slices shown (16 of 19)]
[im 1/1]
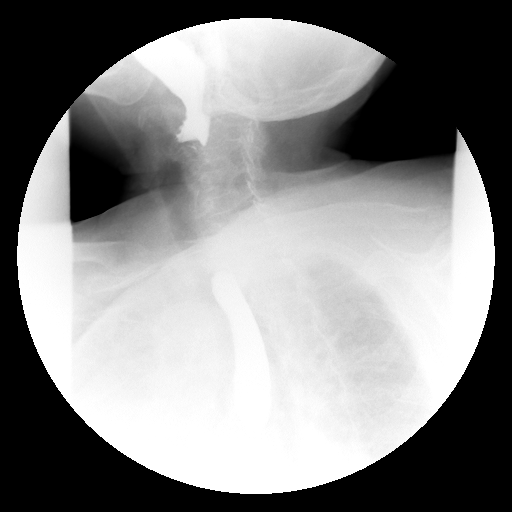

[Series 21: run · 1 of 1 slices shown (17 of 19)]
[im 1/1]
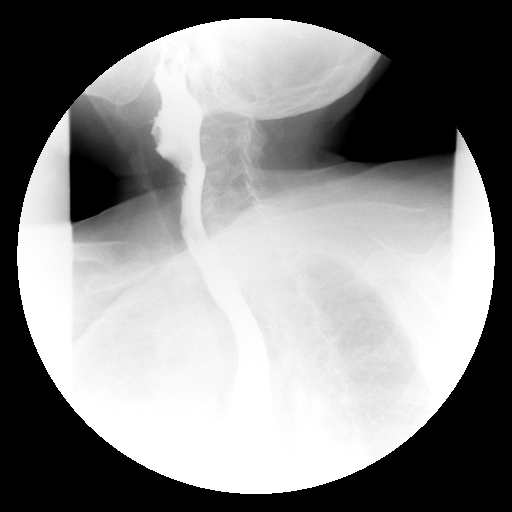

[Series 23: run · 1 of 1 slices shown (18 of 19)]
[im 1/1]
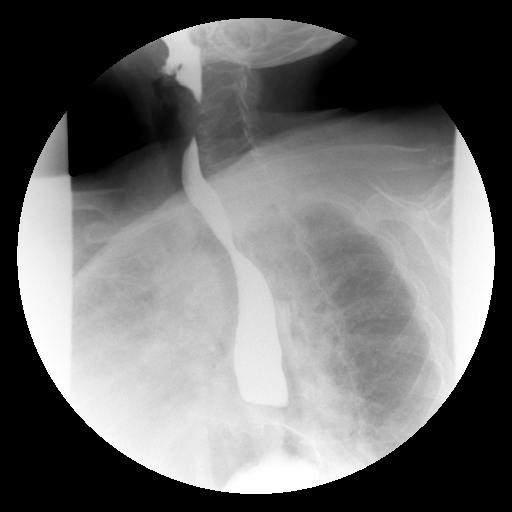

[Series 24: run · 1 of 1 slices shown (19 of 19)]
[im 1/1]
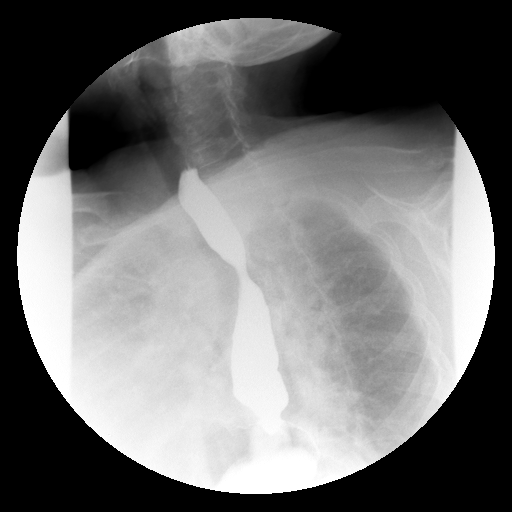

[19 of 24 positions shown; findings below may reference images not displayed]

FINDINGS: Hypo pharyngeal portion of the exam demonstrates no
significant findings.  Double contrast evaluation of the esophagus
demonstrates no mucosal abnormality.

There is spontaneous gastroesophageal reflux to the mid to upper
thoracic esophagus.  Primary peristalsis is normal for age.

A large hiatal hernia.  Approximately one half of the stomach
within the chest.  No evidence of volvulus.  No obstruction.

A 13 mm barium tablet passes without difficulty.
IMPRESSION: 1.  Large hiatal hernia.
2.  Spontaneous gastroesophageal reflux to the mid to upper
thoracic esophagus.

## 2013-11-17 ENCOUNTER — Other Ambulatory Visit (HOSPITAL_COMMUNITY): Payer: Self-pay | Admitting: Physician Assistant

## 2013-11-17 DIAGNOSIS — C50912 Malignant neoplasm of unspecified site of left female breast: Secondary | ICD-10-CM

## 2013-11-25 ENCOUNTER — Encounter (HOSPITAL_COMMUNITY): Payer: BC Managed Care – PPO

## 2013-11-25 ENCOUNTER — Ambulatory Visit (HOSPITAL_COMMUNITY)
Admission: RE | Admit: 2013-11-25 | Discharge: 2013-11-25 | Disposition: A | Discharge status: Home / Self Care | Payer: BC Managed Care – PPO | Source: Ambulatory Visit | Attending: Hematology and Oncology | Admitting: Hematology and Oncology

## 2013-11-25 DIAGNOSIS — C50912 Malignant neoplasm of unspecified site of left female breast: Secondary | ICD-10-CM

## 2013-11-25 DIAGNOSIS — Z853 Personal history of malignant neoplasm of breast: Secondary | ICD-10-CM | POA: Insufficient documentation

## 2013-11-25 DIAGNOSIS — Z923 Personal history of irradiation: Secondary | ICD-10-CM | POA: Insufficient documentation

## 2017-04-11 ENCOUNTER — Encounter (HOSPITAL_COMMUNITY): Payer: Self-pay

## 2017-04-11 ENCOUNTER — Ambulatory Visit (HOSPITAL_COMMUNITY)
Admission: RE | Admit: 2017-04-11 | Discharge: 2017-04-11 | Disposition: A | Discharge status: Home / Self Care | Payer: Medicare Other | Source: Ambulatory Visit | Attending: Family Medicine | Admitting: Family Medicine

## 2017-04-11 ENCOUNTER — Other Ambulatory Visit (HOSPITAL_COMMUNITY): Payer: Self-pay | Admitting: Family Medicine

## 2017-04-11 DIAGNOSIS — R0609 Other forms of dyspnea: Secondary | ICD-10-CM

## 2017-04-11 DIAGNOSIS — R911 Solitary pulmonary nodule: Secondary | ICD-10-CM | POA: Insufficient documentation

## 2017-04-11 DIAGNOSIS — K449 Diaphragmatic hernia without obstruction or gangrene: Secondary | ICD-10-CM | POA: Insufficient documentation

## 2017-04-11 DIAGNOSIS — I700 Atherosclerosis of aorta: Secondary | ICD-10-CM | POA: Diagnosis not present

## 2017-04-11 DIAGNOSIS — R079 Chest pain, unspecified: Secondary | ICD-10-CM

## 2017-04-11 DIAGNOSIS — I119 Hypertensive heart disease without heart failure: Secondary | ICD-10-CM | POA: Diagnosis not present

## 2017-04-11 DIAGNOSIS — I10 Essential (primary) hypertension: Secondary | ICD-10-CM | POA: Diagnosis present

## 2017-04-11 LAB — POCT I-STAT CREATININE: CREATININE: 1.3 mg/dL — AB (ref 0.44–1.00)

## 2017-04-11 MED ORDER — IOPAMIDOL (ISOVUE-370) INJECTION 76%
100.0000 mL | Freq: Once | INTRAVENOUS | Status: AC | PRN
  Administered 2017-04-11: 80 mL via INTRAVENOUS

## 2017-04-25 ENCOUNTER — Other Ambulatory Visit (HOSPITAL_BASED_OUTPATIENT_CLINIC_OR_DEPARTMENT_OTHER): Payer: Self-pay

## 2017-04-25 DIAGNOSIS — R5383 Other fatigue: Secondary | ICD-10-CM

## 2017-04-25 DIAGNOSIS — R0683 Snoring: Secondary | ICD-10-CM

## 2017-04-25 DIAGNOSIS — G4710 Hypersomnia, unspecified: Secondary | ICD-10-CM

## 2017-04-25 DIAGNOSIS — G4700 Insomnia, unspecified: Secondary | ICD-10-CM

## 2017-04-26 ENCOUNTER — Encounter: Payer: Self-pay | Admitting: Cardiovascular Disease

## 2017-05-02 ENCOUNTER — Encounter: Payer: Self-pay | Admitting: Cardiovascular Disease

## 2017-05-10 ENCOUNTER — Other Ambulatory Visit (HOSPITAL_COMMUNITY): Payer: Self-pay | Admitting: Family Medicine

## 2017-05-10 DIAGNOSIS — Z1231 Encounter for screening mammogram for malignant neoplasm of breast: Secondary | ICD-10-CM

## 2017-05-23 ENCOUNTER — Other Ambulatory Visit (HOSPITAL_COMMUNITY): Payer: Self-pay | Admitting: Family Medicine

## 2017-05-23 DIAGNOSIS — Z9889 Other specified postprocedural states: Secondary | ICD-10-CM

## 2017-05-24 ENCOUNTER — Other Ambulatory Visit (HOSPITAL_COMMUNITY): Payer: Self-pay | Admitting: Family Medicine

## 2017-05-24 DIAGNOSIS — Z9889 Other specified postprocedural states: Secondary | ICD-10-CM

## 2017-05-25 ENCOUNTER — Ambulatory Visit (HOSPITAL_COMMUNITY): Payer: Medicare Other

## 2017-05-29 ENCOUNTER — Encounter (HOSPITAL_COMMUNITY): Payer: Self-pay

## 2017-05-29 ENCOUNTER — Encounter (HOSPITAL_COMMUNITY): Payer: Medicare Other

## 2017-06-07 ENCOUNTER — Ambulatory Visit: Payer: Medicare Other | Admitting: Cardiovascular Disease

## 2017-11-09 ENCOUNTER — Other Ambulatory Visit (HOSPITAL_COMMUNITY): Payer: Self-pay | Admitting: Family Medicine

## 2017-11-09 DIAGNOSIS — R911 Solitary pulmonary nodule: Secondary | ICD-10-CM

## 2017-11-21 ENCOUNTER — Other Ambulatory Visit (HOSPITAL_COMMUNITY): Payer: Self-pay | Admitting: Family Medicine

## 2017-11-21 DIAGNOSIS — Z853 Personal history of malignant neoplasm of breast: Secondary | ICD-10-CM

## 2017-11-26 ENCOUNTER — Other Ambulatory Visit (HOSPITAL_COMMUNITY): Payer: Self-pay | Admitting: Family Medicine

## 2017-11-26 DIAGNOSIS — Z1231 Encounter for screening mammogram for malignant neoplasm of breast: Secondary | ICD-10-CM

## 2017-11-27 ENCOUNTER — Encounter (HOSPITAL_COMMUNITY): Payer: Self-pay

## 2017-11-27 ENCOUNTER — Encounter (HOSPITAL_COMMUNITY): Payer: Medicare Other

## 2017-11-28 ENCOUNTER — Other Ambulatory Visit (HOSPITAL_COMMUNITY): Payer: Self-pay | Admitting: Family Medicine

## 2017-11-28 ENCOUNTER — Ambulatory Visit (HOSPITAL_COMMUNITY)
Admission: RE | Admit: 2017-11-28 | Discharge: 2017-11-28 | Disposition: A | Discharge status: Home / Self Care | Payer: Medicare Other | Source: Ambulatory Visit | Attending: Family Medicine | Admitting: Family Medicine

## 2017-11-28 ENCOUNTER — Encounter (HOSPITAL_COMMUNITY): Payer: Self-pay

## 2017-11-28 DIAGNOSIS — Z1231 Encounter for screening mammogram for malignant neoplasm of breast: Secondary | ICD-10-CM

## 2017-11-28 DIAGNOSIS — E041 Nontoxic single thyroid nodule: Secondary | ICD-10-CM

## 2017-11-28 DIAGNOSIS — R911 Solitary pulmonary nodule: Secondary | ICD-10-CM | POA: Diagnosis not present

## 2017-11-28 DIAGNOSIS — I2510 Atherosclerotic heart disease of native coronary artery without angina pectoris: Secondary | ICD-10-CM | POA: Diagnosis not present

## 2017-11-28 DIAGNOSIS — E042 Nontoxic multinodular goiter: Secondary | ICD-10-CM | POA: Insufficient documentation

## 2017-11-28 DIAGNOSIS — Z853 Personal history of malignant neoplasm of breast: Secondary | ICD-10-CM | POA: Diagnosis not present

## 2017-11-28 DIAGNOSIS — I700 Atherosclerosis of aorta: Secondary | ICD-10-CM | POA: Insufficient documentation

## 2017-11-28 HISTORY — DX: Personal history of irradiation: Z92.3

## 2017-11-28 HISTORY — DX: Malignant neoplasm of unspecified site of unspecified female breast: C50.919

## 2017-11-30 ENCOUNTER — Ambulatory Visit (HOSPITAL_COMMUNITY)
Admission: RE | Admit: 2017-11-30 | Discharge: 2017-11-30 | Disposition: A | Discharge status: Home / Self Care | Payer: Medicare Other | Source: Ambulatory Visit | Attending: Family Medicine | Admitting: Family Medicine

## 2017-11-30 DIAGNOSIS — E041 Nontoxic single thyroid nodule: Secondary | ICD-10-CM | POA: Diagnosis present

## 2017-11-30 DIAGNOSIS — E042 Nontoxic multinodular goiter: Secondary | ICD-10-CM | POA: Insufficient documentation

## 2018-02-12 ENCOUNTER — Other Ambulatory Visit (HOSPITAL_COMMUNITY): Payer: Self-pay | Admitting: Family Medicine

## 2018-02-12 ENCOUNTER — Encounter: Payer: Self-pay | Admitting: Adult Health

## 2018-02-12 DIAGNOSIS — R1011 Right upper quadrant pain: Secondary | ICD-10-CM

## 2018-02-18 ENCOUNTER — Telehealth: Payer: Self-pay | Admitting: Adult Health

## 2018-02-18 NOTE — Telephone Encounter (Signed)
Received records from  [facility], Appt 02/26/18 @ 10:00AM. [place]

## 2018-02-20 ENCOUNTER — Ambulatory Visit (HOSPITAL_COMMUNITY)
Admission: RE | Admit: 2018-02-20 | Discharge: 2018-02-20 | Disposition: A | Discharge status: Home / Self Care | Payer: Medicare Other | Source: Ambulatory Visit | Attending: Family Medicine | Admitting: Family Medicine

## 2018-02-20 DIAGNOSIS — R1011 Right upper quadrant pain: Secondary | ICD-10-CM | POA: Diagnosis not present

## 2018-02-20 DIAGNOSIS — R932 Abnormal findings on diagnostic imaging of liver and biliary tract: Secondary | ICD-10-CM | POA: Diagnosis not present

## 2018-02-22 ENCOUNTER — Ambulatory Visit: Payer: Medicare Other | Admitting: Cardiovascular Disease

## 2018-02-22 ENCOUNTER — Encounter: Payer: Self-pay | Admitting: Cardiovascular Disease

## 2018-02-22 VITALS — BP 142/84 | HR 59 | Ht 59.0 in | Wt 157.0 lb

## 2018-02-22 DIAGNOSIS — I499 Cardiac arrhythmia, unspecified: Secondary | ICD-10-CM

## 2018-02-22 DIAGNOSIS — R011 Cardiac murmur, unspecified: Secondary | ICD-10-CM | POA: Diagnosis not present

## 2018-02-22 DIAGNOSIS — I471 Supraventricular tachycardia: Secondary | ICD-10-CM | POA: Diagnosis not present

## 2018-02-22 DIAGNOSIS — R002 Palpitations: Secondary | ICD-10-CM

## 2018-02-22 DIAGNOSIS — I10 Essential (primary) hypertension: Secondary | ICD-10-CM | POA: Diagnosis not present

## 2018-02-22 DIAGNOSIS — E785 Hyperlipidemia, unspecified: Secondary | ICD-10-CM

## 2018-02-22 NOTE — Progress Notes (Signed)
CARDIOLOGY CONSULT NOTE  Patient ID: [name] [name] MRN: [id number] DOB/AGE: 1941-05-09 77 y.o.  Admit date: (Not on file) Primary Physician: Zhou-Talbert, Elwyn Lade, MD Referring Physician: Alfonse Flavors, MD  Reason for Consultation: SVT  HPI: [name] [name] is a 77 y.o. female who is being seen today for the evaluation of SVT at the request of Zhou-Talbert, Serena S,*.  Past medical history includes resistant hypertension, SVT, chronic kidney disease stage III, left-sided breast cancer status post lumpectomy and radiation, anemia, and hyperlipidemia.  I reviewed notes from her PCP.  It appears based on PCP notes that her heart rate was in the 100 bpm range with a narrow complex and without discernible P waves.  There were adjustments made to metoprolol.  PCP thought that this was secondary to relative hypovolemia due to recent GI illness.  I reviewed labs dated 02/12/2018: BUN 24, creatinine 1.09, sodium 137, potassium 4.3, albumin 4, normal liver transaminases, white blood cell 7.3, hemoglobin 12, platelets 281.  TSH was normal at 1.83, vitamin D D levels were low at 21.  I personally reviewed an ECG from her PCP which demonstrated sinus bradycardia, 58 bpm, with nonspecific ST segment abnormalities in leads I and aVL.  She has been evaluated by Dr. Johnsie Cancel in the past.  At that time she had palpitations which were thought to be due to anxiety and stress.  She had some chest pains at that time as well.  She tells me she underwent an unremarkable cardiac catheterization in 2004.  She tells me she has had palpitations all her life.  They can occur every so often.  She denies associated lightheadedness, dizziness, and syncope.  She denies exertional chest pain and dyspnea.  She did have an episode of right lower quadrant pain which then radiated to the left side of her chest and into her left shoulder and down her arm.  This is the only time this is ever happened.  She was  also told she had a cardiac murmur.    Allergies  Allergen Reactions  . Celecoxib     Current Outpatient Medications  Medication Sig Dispense Refill  . amLODipine (NORVASC) 10 MG tablet Take 10 mg by mouth daily.    . Cholecalciferol 10000 units CAPS Take 1,000 Units by mouth daily.    Marland Kitchen esomeprazole (NEXIUM) 20 MG capsule Take 20 mg by mouth daily at 12 noon.    . ferrous sulfate 325 (65 FE) MG tablet Take 325 mg by mouth daily with breakfast.    . folic acid (FOLVITE) 782 MCG tablet Take 400 mcg by mouth daily.    Marland Kitchen losartan-hydrochlorothiazide (HYZAAR) 100-25 MG tablet Take 1 tablet by mouth daily.    . metoprolol succinate (TOPROL-XL) 25 MG 24 hr tablet Take 25 mg by mouth daily.    . pravastatin (PRAVACHOL) 40 MG tablet Take 40 mg by mouth daily.     No current facility-administered medications for this visit.     Past Medical History:  Diagnosis Date  . Breast cancer ([place]) 2009  . Hyperlipidemia   . Personal history of radiation therapy     Past Surgical History:  Procedure Laterality Date  . BREAST LUMPECTOMY Left 2009    Social History   Socioeconomic History  . Marital status: Single    Spouse name: Not on file  . Number of children: Not on file  . Years of education: Not on file  . Highest education level: Not  on file  Occupational History  . Not on file  Social Needs  . Financial resource strain: Not on file  . Food insecurity:    Worry: Not on file    Inability: Not on file  . Transportation needs:    Medical: Not on file    Non-medical: Not on file  Tobacco Use  . Smoking status: Never Smoker  . Smokeless tobacco: Never Used  Substance and Sexual Activity  . Alcohol use: Never    Frequency: Never  . Drug use: Never  . Sexual activity: Not on file  Lifestyle  . Physical activity:    Days per week: Not on file    Minutes per session: Not on file  . Stress: Not on file  Relationships  . Social connections:    Talks on phone: Not on file     Gets together: Not on file    Attends religious service: Not on file    Active member of club or organization: Not on file    Attends meetings of clubs or organizations: Not on file    Relationship status: Not on file  . Intimate partner violence:    Fear of current or ex partner: Not on file    Emotionally abused: Not on file    Physically abused: Not on file    Forced sexual activity: Not on file  Other Topics Concern  . Not on file  Social History Narrative  . Not on file     No family history of premature CAD in 1st degree relatives.  Current Meds  Medication Sig  . amLODipine (NORVASC) 10 MG tablet Take 10 mg by mouth daily.  . Cholecalciferol 10000 units CAPS Take 1,000 Units by mouth daily.  Marland Kitchen esomeprazole (NEXIUM) 20 MG capsule Take 20 mg by mouth daily at 12 noon.  . ferrous sulfate 325 (65 FE) MG tablet Take 325 mg by mouth daily with breakfast.  . folic acid (FOLVITE) 062 MCG tablet Take 400 mcg by mouth daily.  Marland Kitchen losartan-hydrochlorothiazide (HYZAAR) 100-25 MG tablet Take 1 tablet by mouth daily.  . metoprolol succinate (TOPROL-XL) 25 MG 24 hr tablet Take 25 mg by mouth daily.  . pravastatin (PRAVACHOL) 40 MG tablet Take 40 mg by mouth daily.      Review of systems complete and found to be negative unless listed above in HPI    Physical exam Blood pressure (!) 142/84, pulse (!) 59, height 4\' 11"  (1.499 m), weight 157 lb (71.2 kg), SpO2 98 %. General: NAD Neck: No JVD, no thyromegaly or thyroid nodule.  Lungs: Clear to auscultation bilaterally with normal respiratory effort. CV: Nondisplaced PMI. Regular rate and rhythm, normal S1/S2, no S3/S4, 2/4 hole a diastolic murmur heard loudest over right upper sternal border.  No peripheral edema.  No carotid bruit.    Abdomen: Soft, nontender, no distention.  Skin: Intact without lesions or rashes.  Neurologic: Alert and oriented x 3.  Psych: Normal affect. Extremities: No clubbing or cyanosis.  HEENT: Normal.    ECG: Most recent ECG reviewed.   Labs: Lab Results  Component Value Date/Time   K 4.0 11/14/2010 10:47 AM   BUN 19 11/14/2010 10:47 AM   CREATININE 1.30 (H) 04/11/2017 09:36 AM   ALT 28 08/04/2010 03:25 PM   HGB 12.1 11/14/2010 10:47 AM     Lipids: No results found for: LDLCALC, LDLDIRECT, CHOL, TRIG, HDL      ASSESSMENT AND PLAN:  1.  Arrhythmia with tachycardia and palpitations/SVT:  I will obtain a 1 month event monitor for further clarification.  She is currently on Toprol-XL 25 mg daily.  I will also try to obtain the ECG which demonstrated a narrow QRS tachycardia from her PCP.  I tried obtaining this today but their office is closed today.  2.  Resistant hypertension: Currently on amlodipine, Hyzaar, and Toprol-XL.  Blood pressure is mildly elevated.  It appears she has had an extensive work-up for this by her PCP.  3.  Hyperlipidemia: Lipids reviewed above.  Currently on pravastatin 40 mg.  4.  Cardiac murmur: She has a holodiastolic murmur suspicious for aortic regurgitation. I will order a 2-D echocardiogram with Doppler to evaluate cardiac structure, function, and regional wall motion.     Disposition: Follow up in 3 months  Signed: Kate Sable, M.D., F.A.C.C.  02/22/2018, 2:24 PM

## 2018-02-22 NOTE — Patient Instructions (Signed)
Medication Instructions:  Your physician recommends that you continue on your current medications as directed. Please refer to the Current Medication list given to you today.  If you need a refill on your cardiac medications before your next appointment, please call your pharmacy.   Lab work: NONE If you have labs (blood work) drawn today and your tests are completely normal, you will receive your results only by: Marland Kitchen MyChart Message (if you have MyChart) OR . A paper copy in the mail If you have any lab test that is abnormal or we need to change your treatment, we will call you to review the results.    Testing/Procedures: Your physician has requested that you have an echocardiogram. Echocardiography is a painless test that uses sound waves to create images of your heart. It provides your doctor with information about the size and shape of your heart and how well your heart's chambers and valves are working. This procedure takes approximately one hour. There are no restrictions for this procedure.    Your physician has recommended that you wear an event monitor for 30 days. Event monitors are medical devices that record the heart's electrical activity. Doctors most often [place] these monitors to diagnose arrhythmias. Arrhythmias are problems with the speed or rhythm of the heartbeat. The monitor is a small, portable device. You can wear one while you do your normal daily activities. This is usually used to diagnose what is causing palpitations/syncope (passing out).    Follow-Up: At [facility], you and your health needs are our priority.  As part of our continuing mission to provide you with exceptional heart care, we have created designated Provider Care Teams.  These Care Teams include your primary Cardiologist (physician) and Advanced Practice Providers (APPs -  Physician Assistants and Nurse Practitioners) who all work together to provide you with the care you need, when you need it. . 3  months with Dr.Koneswaran  Any Other Special Instructions Will Be Listed Below (If Applicable). NONE        Thank you for choosing [facility] !

## 2018-02-25 ENCOUNTER — Ambulatory Visit: Payer: Medicare Other | Admitting: Cardiology

## 2018-02-26 ENCOUNTER — Ambulatory Visit: Payer: Medicare Other | Admitting: Adult Health

## 2018-02-27 ENCOUNTER — Ambulatory Visit (INDEPENDENT_AMBULATORY_CARE_PROVIDER_SITE_OTHER): Payer: Medicare Other

## 2018-02-27 ENCOUNTER — Ambulatory Visit (HOSPITAL_COMMUNITY)
Admission: RE | Admit: 2018-02-27 | Discharge: 2018-02-27 | Disposition: A | Discharge status: Home / Self Care | Payer: Medicare Other | Source: Ambulatory Visit | Attending: Cardiovascular Disease | Admitting: Cardiovascular Disease

## 2018-02-27 DIAGNOSIS — I10 Essential (primary) hypertension: Secondary | ICD-10-CM | POA: Insufficient documentation

## 2018-02-27 DIAGNOSIS — R011 Cardiac murmur, unspecified: Secondary | ICD-10-CM | POA: Diagnosis not present

## 2018-02-27 DIAGNOSIS — I209 Angina pectoris, unspecified: Secondary | ICD-10-CM | POA: Insufficient documentation

## 2018-02-27 DIAGNOSIS — I499 Cardiac arrhythmia, unspecified: Secondary | ICD-10-CM

## 2018-02-27 DIAGNOSIS — E785 Hyperlipidemia, unspecified: Secondary | ICD-10-CM | POA: Diagnosis not present

## 2018-02-27 DIAGNOSIS — R0600 Dyspnea, unspecified: Secondary | ICD-10-CM | POA: Diagnosis not present

## 2018-02-27 NOTE — Progress Notes (Signed)
*  PRELIMINARY RESULTS* Echocardiogram 2D Echocardiogram has been performed.  [name] [name] 02/27/2018, 9:20 AM

## 2018-04-02 ENCOUNTER — Telehealth: Payer: Self-pay | Admitting: *Deleted

## 2018-04-02 NOTE — Telephone Encounter (Signed)
-----   Message from Herminio Commons, MD sent at 04/02/2018 12:42 PM EST ----- No significant arrhythmias.

## 2018-04-02 NOTE — Telephone Encounter (Signed)
Called patient with test results. No answer. Left message to call back.  

## 2018-04-03 NOTE — Telephone Encounter (Signed)
Pt made aware of results.

## 2018-04-03 NOTE — Telephone Encounter (Signed)
Returning call for results  °

## 2018-06-04 ENCOUNTER — Encounter: Payer: Self-pay | Admitting: Cardiovascular Disease

## 2018-06-04 ENCOUNTER — Ambulatory Visit: Payer: Medicare Other | Admitting: Cardiovascular Disease

## 2018-06-04 VITALS — BP 142/81 | HR 64 | Ht 61.0 in | Wt 159.0 lb

## 2018-06-04 DIAGNOSIS — I471 Supraventricular tachycardia: Secondary | ICD-10-CM

## 2018-06-04 DIAGNOSIS — I10 Essential (primary) hypertension: Secondary | ICD-10-CM

## 2018-06-04 DIAGNOSIS — I351 Nonrheumatic aortic (valve) insufficiency: Secondary | ICD-10-CM

## 2018-06-04 DIAGNOSIS — R002 Palpitations: Secondary | ICD-10-CM | POA: Diagnosis not present

## 2018-06-04 DIAGNOSIS — E785 Hyperlipidemia, unspecified: Secondary | ICD-10-CM

## 2018-06-04 NOTE — Progress Notes (Signed)
SUBJECTIVE: The patient returns for follow-up after undergoing cardiovascular testing performed for the evaluation of palpitations.  Echocardiogram performed on 02/27/2018 demonstrated normal left ventricular systolic function, LVEF 55 to 60%, mild LVH, mildly calcified aortic annulus with mildly calcified leaflets and moderate aortic regurgitation directed eccentrically in the left ventricular outflow tract.  A PFO could not entirely be excluded.  Event monitoring demonstrated sinus rhythm and sinus bradycardia with rare PVCs.  The patient denies any symptoms of chest pain, palpitations, shortness of breath, lightheadedness, dizziness, leg swelling, orthopnea, PND, and syncope.  She told me about her son who was involved in a serious motor vehicle accident in April 2013 and has traumatic brain injury.  She told me she over ate over the holidays but plans to begin exercising.    Review of Systems: As per "subjective", otherwise negative.  Allergies  Allergen Reactions  . Celecoxib     Current Outpatient Medications  Medication Sig Dispense Refill  . amLODipine (NORVASC) 10 MG tablet Take 10 mg by mouth daily.    . Cholecalciferol 10000 units CAPS Take 1,000 Units by mouth daily.    Marland Kitchen esomeprazole (NEXIUM) 20 MG capsule Take 20 mg by mouth daily at 12 noon.    . ferrous sulfate 325 (65 FE) MG tablet Take 325 mg by mouth daily with breakfast.    . FLUoxetine (PROZAC) 40 MG capsule Take 40 mg by mouth daily.    . folic acid (FOLVITE) 496 MCG tablet Take 400 mcg by mouth daily.    Marland Kitchen losartan-hydrochlorothiazide (HYZAAR) 100-25 MG tablet Take 1 tablet by mouth daily.    . metoprolol succinate (TOPROL-XL) 25 MG 24 hr tablet Take 25 mg by mouth daily.    . pravastatin (PRAVACHOL) 40 MG tablet Take 40 mg by mouth daily.     No current facility-administered medications for this visit.     Past Medical History:  Diagnosis Date  . Breast cancer ([place]) 2009  . Hyperlipidemia   .  Personal history of radiation therapy     Past Surgical History:  Procedure Laterality Date  . BREAST LUMPECTOMY Left 2009    Social History   Socioeconomic History  . Marital status: Single    Spouse name: Not on file  . Number of children: Not on file  . Years of education: Not on file  . Highest education level: Not on file  Occupational History  . Not on file  Social Needs  . Financial resource strain: Not on file  . Food insecurity:    Worry: Not on file    Inability: Not on file  . Transportation needs:    Medical: Not on file    Non-medical: Not on file  Tobacco Use  . Smoking status: Never Smoker  . Smokeless tobacco: Never Used  Substance and Sexual Activity  . Alcohol use: Never    Frequency: Never  . Drug use: Never  . Sexual activity: Not on file  Lifestyle  . Physical activity:    Days per week: Not on file    Minutes per session: Not on file  . Stress: Not on file  Relationships  . Social connections:    Talks on phone: Not on file    Gets together: Not on file    Attends religious service: Not on file    Active member of club or organization: Not on file    Attends meetings of clubs or organizations: Not on file    Relationship  status: Not on file  . Intimate partner violence:    Fear of current or ex partner: Not on file    Emotionally abused: Not on file    Physically abused: Not on file    Forced sexual activity: Not on file  Other Topics Concern  . Not on file  Social History Narrative  . Not on file     Vitals:   06/04/18 0846  BP: (!) 142/81  Pulse: 64  SpO2: 98%  Weight: 159 lb (72.1 kg)  Height: 5\' 1"  (1.549 m)    Wt Readings from Last 3 Encounters:  06/04/18 159 lb (72.1 kg)  02/22/18 157 lb (71.2 kg)     PHYSICAL EXAM General: NAD HEENT: Normal. Neck: No JVD, no thyromegaly. Lungs: Clear to auscultation bilaterally with normal respiratory effort. CV: Regular rate and rhythm, normal S1/S2, no S3/S4, 2/4  holodiastolic murmur heard loudest over right upper sternal border. No pretibial or periankle edema.   Abdomen: Soft, nontender, no distention.  Neurologic: Alert and oriented.  Psych: Normal affect. Skin: Normal. Musculoskeletal: No gross deformities.    ECG: Reviewed above under Subjective   Labs: Lab Results  Component Value Date/Time   K 4.0 11/14/2010 10:47 AM   BUN 19 11/14/2010 10:47 AM   CREATININE 1.30 (H) 04/11/2017 09:36 AM   ALT 28 08/04/2010 03:25 PM   HGB 12.1 11/14/2010 10:47 AM     Lipids: No results found for: LDLCALC, LDLDIRECT, CHOL, TRIG, HDL     ASSESSMENT AND PLAN: 1.  Arrhythmia with tachycardia and palpitations/SVT: Event monitor reviewed above with no significant arrhythmias and specifically no SVT.    Symptomatically improved.  She is currently on Toprol-XL 25 mg daily.  Continue current therapy.  2.  Resistant hypertension: Currently on amlodipine, Hyzaar, and Toprol-XL.  Blood pressure is mildly elevated.  It appears she has had an extensive work-up for this by her PCP.  3.  Hyperlipidemia: Lipids previously reviewed.  Currently on pravastatin 40 mg.  4.    Aortic regurgitation: Moderate in severity.  I will obtain a follow-up echocardiogram in 1 year prior to her next visit.   Disposition: Follow up 1 year   Kate Sable, M.D., F.A.C.C.

## 2018-06-04 NOTE — Patient Instructions (Signed)
Medication Instructions:  Your physician recommends that you continue on your current medications as directed. Please refer to the Current Medication list given to you today.  If you need a refill on your cardiac medications before your next appointment, please call your pharmacy.   Lab work: None today  If you have labs (blood work) drawn today and your tests are completely normal, you will receive your results only by: Marland Kitchen MyChart Message (if you have MyChart) OR . A paper copy in the mail If you have any lab test that is abnormal or we need to change your treatment, we will call you to review the results.  Testing/Procedures: Your physician has requested that you have an echocardiogram IN [place] VISIT. Echocardiography is a painless test that uses sound waves to create images of your heart. It provides your doctor with information about the size and shape of your heart and how well your heart's chambers and valves are working. This procedure takes approximately one hour. There are no restrictions for this procedure.    Follow-Up: At [facility], you and your health needs are our priority.  As part of our continuing mission to provide you with exceptional heart care, we have created designated Provider Care Teams.  These Care Teams include your primary Cardiologist (physician) and Advanced Practice Providers (APPs -  Physician Assistants and Nurse Practitioners) who all work together to provide you with the care you need, when you need it. You will need a follow up appointment in 1 years.  Please call our office 2 months in advance to schedule this appointment.  You may see Kate Sable, MD or one of the following Advanced Practice Providers on your designated Care Team:   Bernerd Pho, PA-C [facility]) . Ermalinda Barrios, PA-C ([place])  Any Other Special Instructions Will Be Listed Below (If Applicable). None

## 2019-05-28 ENCOUNTER — Ambulatory Visit (HOSPITAL_COMMUNITY): Payer: Medicare Other | Attending: Cardiovascular Disease

## 2024-01-21 DEATH — deceased
